# Patient Record
Sex: Female | Born: 1995 | Race: White | Hispanic: No | Marital: Single
Health system: Southern US, Community
[De-identification: ages and names within clinical notes are randomized; demographics above are authoritative.]

---
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
## 2019-05-14 ENCOUNTER — Ambulatory Visit: Payer: Self-pay | Attending: Internal Medicine

## 2019-05-14 DIAGNOSIS — Z23 Encounter for immunization: Secondary | ICD-10-CM

## 2019-05-14 NOTE — Progress Notes (Signed)
   [facility]ov[facility] Vaccination [facility]  Name:  Mikayla Barnett    MRN: [id number] DOB: December 12, 1995  05/14/2019  [name] was observed post Covid-19 immunization for 15 minutes without incident. She was provided with Vaccine Information Sheet and instruction to access the V-Safe system.   [name] was instructed to call [phone] with any severe reactions post vaccine: Marland Kitchen Difficulty breathing  . Swelling of face and throat  . A fast heartbeat  . A bad rash all over body  . Dizziness and weakness   Immunizations Administered    Name Date Dose VIS Date Route   Moderna COVID-19 Vaccine 05/14/2019 12:48 PM 0.5 mL 12/29/2018 Intramuscular   Manufacturer: Moderna   Lot: [id number]   NDC: 52174-715-95
